# Patient Record
Sex: Female | Born: 1980 | Race: White | Hispanic: No | Marital: Single | Smoking: Never smoker
Health system: Southern US, Community
[De-identification: ages and names within clinical notes are randomized; demographics above are authoritative.]

## PROBLEM LIST (undated history)

## (undated) DIAGNOSIS — R51 Headache: Secondary | ICD-10-CM

## (undated) DIAGNOSIS — Z8619 Personal history of other infectious and parasitic diseases: Secondary | ICD-10-CM

## (undated) DIAGNOSIS — K219 Gastro-esophageal reflux disease without esophagitis: Secondary | ICD-10-CM

## (undated) DIAGNOSIS — C801 Malignant (primary) neoplasm, unspecified: Secondary | ICD-10-CM

## (undated) HISTORY — PX: KNEE SURGERY: SHX244

## (undated) HISTORY — DX: Malignant (primary) neoplasm, unspecified: C80.1

## (undated) HISTORY — PX: BREAST SURGERY: SHX581

## (undated) HISTORY — DX: Personal history of other infectious and parasitic diseases: Z86.19

## (undated) HISTORY — DX: Gastro-esophageal reflux disease without esophagitis: K21.9

## (undated) HISTORY — DX: Headache: R51

## (undated) HISTORY — PX: TONSILLECTOMY: SUR1361

---

## 2007-05-24 ENCOUNTER — Other Ambulatory Visit: Payer: Self-pay

## 2007-05-24 ENCOUNTER — Emergency Department: Payer: Self-pay

## 2007-09-19 ENCOUNTER — Ambulatory Visit: Payer: Self-pay | Admitting: Internal Medicine

## 2008-07-17 ENCOUNTER — Ambulatory Visit: Payer: Self-pay | Admitting: Sports Medicine

## 2009-08-20 ENCOUNTER — Emergency Department: Payer: Self-pay | Admitting: Emergency Medicine

## 2010-02-10 ENCOUNTER — Ambulatory Visit: Payer: Self-pay | Admitting: Unknown Physician Specialty

## 2011-03-25 ENCOUNTER — Ambulatory Visit: Payer: Self-pay | Admitting: Unknown Physician Specialty

## 2012-08-14 ENCOUNTER — Ambulatory Visit: Payer: Self-pay | Admitting: Family Medicine

## 2012-08-17 LAB — OB RESULTS CONSOLE ABO/RH: RH Type: POSITIVE

## 2012-08-17 LAB — OB RESULTS CONSOLE HIV ANTIBODY (ROUTINE TESTING): HIV: NONREACTIVE

## 2012-08-17 LAB — OB RESULTS CONSOLE ANTIBODY SCREEN: Antibody Screen: NEGATIVE

## 2012-08-17 LAB — OB RESULTS CONSOLE RPR: RPR: NONREACTIVE

## 2012-08-17 LAB — OB RESULTS CONSOLE RUBELLA ANTIBODY, IGM: Rubella: NON-IMMUNE/NOT IMMUNE

## 2012-08-27 ENCOUNTER — Other Ambulatory Visit: Payer: Self-pay

## 2012-08-27 LAB — OB RESULTS CONSOLE GC/CHLAMYDIA
Chlamydia: NEGATIVE
Gonorrhea: NEGATIVE

## 2012-10-17 ENCOUNTER — Emergency Department: Payer: Self-pay | Admitting: Emergency Medicine

## 2012-10-17 LAB — CBC WITH DIFFERENTIAL/PLATELET
Basophil #: 0.1 10*3/uL (ref 0.0–0.1)
Basophil %: 0.7 %
Eosinophil #: 0 10*3/uL (ref 0.0–0.7)
Eosinophil %: 0.5 %
HGB: 11.2 g/dL — ABNORMAL LOW (ref 12.0–16.0)
Lymphocyte #: 1.5 10*3/uL (ref 1.0–3.6)
Lymphocyte %: 17.1 %
MCH: 30.7 pg (ref 26.0–34.0)
Monocyte #: 0.3 x10 3/mm (ref 0.2–0.9)
Monocyte %: 3.6 %
Neutrophil #: 6.9 10*3/uL — ABNORMAL HIGH (ref 1.4–6.5)
Neutrophil %: 78.1 %
Platelet: 284 10*3/uL (ref 150–440)
RBC: 3.63 10*6/uL — ABNORMAL LOW (ref 3.80–5.20)
RDW: 13.5 % (ref 11.5–14.5)

## 2012-10-17 LAB — URINALYSIS, COMPLETE
Bilirubin,UR: NEGATIVE
Blood: NEGATIVE
Hyaline Cast: 3
Protein: NEGATIVE
RBC,UR: 1 /HPF (ref 0–5)
Squamous Epithelial: 2

## 2012-10-17 LAB — BASIC METABOLIC PANEL
BUN: 7 mg/dL (ref 7–18)
Co2: 24 mmol/L (ref 21–32)
Creatinine: 0.49 mg/dL — ABNORMAL LOW (ref 0.60–1.30)
EGFR (African American): >60
EGFR (Non-African Amer.): >60

## 2012-11-19 ENCOUNTER — Other Ambulatory Visit (HOSPITAL_COMMUNITY): Payer: Self-pay | Admitting: Obstetrics

## 2012-11-19 DIAGNOSIS — R9389 Abnormal findings on diagnostic imaging of other specified body structures: Secondary | ICD-10-CM

## 2012-11-20 ENCOUNTER — Encounter (HOSPITAL_COMMUNITY): Payer: Self-pay

## 2012-11-20 ENCOUNTER — Ambulatory Visit (HOSPITAL_COMMUNITY)
Admission: RE | Admit: 2012-11-20 | Discharge: 2012-11-20 | Disposition: A | Discharge status: Home / Self Care | Payer: BC Managed Care – PPO | Source: Ambulatory Visit | Attending: Obstetrics | Admitting: Obstetrics

## 2012-11-20 DIAGNOSIS — R9389 Abnormal findings on diagnostic imaging of other specified body structures: Secondary | ICD-10-CM

## 2012-11-20 DIAGNOSIS — O358XX0 Maternal care for other (suspected) fetal abnormality and damage, not applicable or unspecified: Secondary | ICD-10-CM | POA: Insufficient documentation

## 2012-11-20 DIAGNOSIS — Z1389 Encounter for screening for other disorder: Secondary | ICD-10-CM | POA: Insufficient documentation

## 2012-11-20 DIAGNOSIS — Z363 Encounter for antenatal screening for malformations: Secondary | ICD-10-CM | POA: Insufficient documentation

## 2012-11-21 NOTE — Progress Notes (Signed)
Genetic Counseling  High-Risk Gestation Note  Appointment Date:  11/20/2012 Referred By: Lendon Colonel., MD Date of Birth:  02/03/1981    Pregnancy History: G1P0000 Estimated Date of Delivery: 03/16/13 Estimated Gestational Age: [redacted]w[redacted]d Attending: Particia Nearing, MD   Ms. Krista Alexander was seen for genetic counseling because of the previous ultrasound finding of possible ventriculomegaly. The patient was accompanied by a friend to today's visit.   We discussed that medical records from the patient's OB office indicated a lateral ventricle measurement of 1.05 cm (or 10.5 mm).   We counseled the patient that ventriculomegaly refers to an increased measurement of the lateral ventricles in the brain greater than 10 mm.  Possible causes for mild ventriculomegaly include overproduction of cerebrospinal fluid, a blockage in a duct causing the fluid to accumulate, an intrauterine infection, or a variation of normal. We discussed that once ventriculomegaly is identified in pregnancy, follow-up ultrasounds are offered to assess for progression of ventriculomegaly. Following delivery, post-natal studies may be required in order to track progress and assess for any other differences in brain anatomy. It was discussed that surgical intervention may be required in some cases of ventriculomegaly. Most cases of isolated mild ventriculomegaly do not have an identified cause or associated condition, tend to regress with time, and have no resulting health or learning problems. However, it is possible that this finding may be seen in association with other ultrasound differences or a genetic condition. There is also a slightly increased chance of developmental delays. We discussed that the presence of ventriculomegaly is associated with an increased risk for aneuploidy, specifically Down syndrome.   We reviewed chromosomes, nondisjunction, and the features and prognosis of Down syndrome. She was counseled  regarding maternal age and the association with risk for chromosome conditions due to nondisjunction with aging of ova. Given the patient's age alone, the pregnancy would not be considered at increased risk for fetal aneuploidy. We reviewed that Krista Alexander previously had first trimester screening, which increased the risk for fetal Down syndrome from her age related risk of 1 in 110 to 1 in 61. However, this is still considered within the normal range. We discussed that in the case of an ultrasound finding of mild ventriculomegaly, the risk for Down syndrome would be increased from the patient's first trimester screen risk to approximately 1 in 39. The patient was counseled regarding the options of ultrasound, noninvasive prenatal screening (NIPS)/cell free fetal DNA testing and amniocentesis. The risks, benefits, and limitations of each screen were reviewed. Specifically, we discussed the conditions for which each test screens, the detection rates, and false positive rates of each.  It was discussed that not all birth defects can be identified with these procedures. A risk of 1 in 300-500 was given for amniocentesis, the primary complication being spontaneous pregnancy loss or preterm delivery.  After careful consideration, she decided to continue with ultrasound but declined amniocentesis and NIPS.  Targeted ultrasound was performed at the time of today's visit. Visualized fetal anatomy appeared normal. The left cerebral ventricle measured within normal range. The right cerebral ventricle could not be visualized and assessed due to fetal position. No markers of aneuploidy were visualized at the time of today's ultrasound. Complete ultrasound results reported separately. A follow-up ultrasound is recommended in approximately 6 weeks to reassess fetal cerebral ventricles. No appointments were scheduled in our office at this time. We are happy to facilitate this appointment, if desired.   Both family  histories were reviewed and found to  be noncontributory for birth defects, mental retardation, and known genetic conditions. Without further information regarding the provided family history, an accurate genetic risk cannot be calculated. Further genetic counseling is warranted if more information is obtained.  Krista Alexander denied exposure to environmental toxins or chemical agents. She denied the use of alcohol, tobacco or street drugs. She denied significant viral illnesses during the course of her pregnancy. Her medical and surgical histories were noncontributory.   I counseled Ms. Krista Alexander regarding the above risks and available options.  The approximate face-to-face time with the genetic counselor was 35 minutes.  Quinn Plowman, MS,  Certified Genetic Counselor 11/21/2012

## 2013-02-18 LAB — OB RESULTS CONSOLE GBS: GBS: NEGATIVE

## 2013-03-16 ENCOUNTER — Inpatient Hospital Stay (HOSPITAL_COMMUNITY): Admission: AD | Admit: 2013-03-16 | Payer: Self-pay | Source: Ambulatory Visit | Admitting: Obstetrics

## 2013-03-20 ENCOUNTER — Other Ambulatory Visit: Payer: Self-pay | Admitting: Obstetrics

## 2013-03-21 ENCOUNTER — Telehealth (HOSPITAL_COMMUNITY): Payer: Self-pay | Admitting: *Deleted

## 2013-03-21 ENCOUNTER — Encounter (HOSPITAL_COMMUNITY): Payer: Self-pay | Admitting: *Deleted

## 2013-03-21 NOTE — Telephone Encounter (Signed)
Preadmission screen  

## 2013-03-26 ENCOUNTER — Inpatient Hospital Stay (HOSPITAL_COMMUNITY): Payer: BC Managed Care – PPO | Admitting: Anesthesiology

## 2013-03-26 ENCOUNTER — Inpatient Hospital Stay (HOSPITAL_COMMUNITY)
Admission: RE | Admit: 2013-03-26 | Discharge: 2013-03-30 | DRG: 765 | Disposition: A | Discharge status: Home / Self Care | Payer: BC Managed Care – PPO | Source: Ambulatory Visit | Attending: Obstetrics | Admitting: Obstetrics

## 2013-03-26 ENCOUNTER — Encounter (HOSPITAL_COMMUNITY): Payer: BC Managed Care – PPO | Admitting: Anesthesiology

## 2013-03-26 ENCOUNTER — Encounter (HOSPITAL_COMMUNITY): Payer: Self-pay

## 2013-03-26 DIAGNOSIS — O9902 Anemia complicating childbirth: Secondary | ICD-10-CM | POA: Diagnosis present

## 2013-03-26 DIAGNOSIS — O480 Post-term pregnancy: Principal | ICD-10-CM | POA: Diagnosis present

## 2013-03-26 DIAGNOSIS — O358XX0 Maternal care for other (suspected) fetal abnormality and damage, not applicable or unspecified: Secondary | ICD-10-CM | POA: Diagnosis present

## 2013-03-26 DIAGNOSIS — D62 Acute posthemorrhagic anemia: Secondary | ICD-10-CM | POA: Diagnosis present

## 2013-03-26 LAB — ABO/RH: ABO/RH(D): A POS

## 2013-03-26 LAB — CBC
HCT: 32.2 % — ABNORMAL LOW (ref 36.0–46.0)
Hemoglobin: 11.2 g/dL — ABNORMAL LOW (ref 12.0–15.0)
MCHC: 34.8 g/dL (ref 30.0–36.0)
Platelets: 248 10*3/uL (ref 150–400)
RDW: 13.6 % (ref 11.5–15.5)
WBC: 9.6 10*3/uL (ref 4.0–10.5)

## 2013-03-26 LAB — TYPE AND SCREEN: Antibody Screen: NEGATIVE

## 2013-03-26 LAB — RPR: RPR Ser Ql: NONREACTIVE

## 2013-03-26 MED ORDER — PHENYLEPHRINE 40 MCG/ML (10ML) SYRINGE FOR IV PUSH (FOR BLOOD PRESSURE SUPPORT)
80.0000 ug | PREFILLED_SYRINGE | INTRAVENOUS | Status: DC | PRN
  Administered 2013-03-26: 80 ug via INTRAVENOUS

## 2013-03-26 MED ORDER — OXYTOCIN BOLUS FROM INFUSION: 500.0000 mL | INTRAVENOUS | Status: DC

## 2013-03-26 MED ORDER — OXYTOCIN 40 UNITS IN LACTATED RINGERS INFUSION - SIMPLE MED: 62.5000 mL/h | INTRAVENOUS | Status: DC

## 2013-03-26 MED ORDER — ACETAMINOPHEN 325 MG PO TABS: 650.0000 mg | ORAL_TABLET | ORAL | Status: DC | PRN

## 2013-03-26 MED ORDER — DIPHENHYDRAMINE HCL 50 MG/ML IJ SOLN: 12.5000 mg | INTRAMUSCULAR | Status: DC | PRN

## 2013-03-26 MED ORDER — IBUPROFEN 600 MG PO TABS: 600.0000 mg | ORAL_TABLET | Freq: Four times a day (QID) | ORAL | Status: DC | PRN

## 2013-03-26 MED ORDER — LACTATED RINGERS IV SOLN
INTRAVENOUS | Status: DC
  Administered 2013-03-26 – 2013-03-27 (×6): via INTRAVENOUS

## 2013-03-26 MED ORDER — CITRIC ACID-SODIUM CITRATE 334-500 MG/5ML PO SOLN
30.0000 mL | ORAL | Status: DC | PRN
  Administered 2013-03-27: 30 mL via ORAL
  Filled 2013-03-26: qty 15

## 2013-03-26 MED ORDER — FLEET ENEMA 7-19 GM/118ML RE ENEM: 1.0000 | ENEMA | RECTAL | Status: DC | PRN

## 2013-03-26 MED ORDER — FENTANYL 2.5 MCG/ML BUPIVACAINE 1/10 % EPIDURAL INFUSION (WH - ANES)
14.0000 mL/h | INTRAMUSCULAR | Status: DC | PRN
  Administered 2013-03-26: 16 mL/h via EPIDURAL
  Administered 2013-03-27: 14 mL/h via EPIDURAL
  Filled 2013-03-26 (×2): qty 125

## 2013-03-26 MED ORDER — ONDANSETRON HCL 4 MG/2ML IJ SOLN
4.0000 mg | Freq: Four times a day (QID) | INTRAMUSCULAR | Status: DC | PRN
  Administered 2013-03-26: 4 mg via INTRAVENOUS
  Filled 2013-03-26: qty 2

## 2013-03-26 MED ORDER — LACTATED RINGERS IV SOLN
500.0000 mL | Freq: Once | INTRAVENOUS | Status: AC
  Administered 2013-03-26: 500 mL via INTRAVENOUS

## 2013-03-26 MED ORDER — LIDOCAINE HCL (PF) 1 % IJ SOLN
INTRAMUSCULAR | Status: DC | PRN
  Administered 2013-03-26 (×5): 4 mL

## 2013-03-26 MED ORDER — LACTATED RINGERS IV SOLN
500.0000 mL | INTRAVENOUS | Status: DC | PRN
  Administered 2013-03-27: 1000 mL via INTRAVENOUS

## 2013-03-26 MED ORDER — OXYTOCIN 40 UNITS IN LACTATED RINGERS INFUSION - SIMPLE MED
1.0000 m[IU]/min | INTRAVENOUS | Status: DC
  Administered 2013-03-26: 2 m[IU]/min via INTRAVENOUS
  Administered 2013-03-26: 10 m[IU]/min via INTRAVENOUS
  Filled 2013-03-26: qty 1000

## 2013-03-26 MED ORDER — TERBUTALINE SULFATE 1 MG/ML IJ SOLN
0.2500 mg | Freq: Once | INTRAMUSCULAR | Status: AC | PRN
  Filled 2013-03-26: qty 1

## 2013-03-26 MED ORDER — OXYCODONE-ACETAMINOPHEN 5-325 MG PO TABS: 1.0000 | ORAL_TABLET | ORAL | Status: DC | PRN

## 2013-03-26 MED ORDER — PHENYLEPHRINE 40 MCG/ML (10ML) SYRINGE FOR IV PUSH (FOR BLOOD PRESSURE SUPPORT)
80.0000 ug | PREFILLED_SYRINGE | INTRAVENOUS | Status: DC | PRN
  Filled 2013-03-26: qty 10

## 2013-03-26 MED ORDER — FAMOTIDINE 20 MG PO TABS
20.0000 mg | ORAL_TABLET | Freq: Two times a day (BID) | ORAL | Status: DC
  Administered 2013-03-26: 20 mg via ORAL
  Filled 2013-03-26: qty 1

## 2013-03-26 MED ORDER — LIDOCAINE HCL (PF) 1 % IJ SOLN
30.0000 mL | INTRAMUSCULAR | Status: DC | PRN
  Filled 2013-03-26: qty 30

## 2013-03-26 MED ORDER — EPHEDRINE 5 MG/ML INJ: 10.0000 mg | INTRAVENOUS | Status: DC | PRN

## 2013-03-26 MED ORDER — EPHEDRINE 5 MG/ML INJ
10.0000 mg | INTRAVENOUS | Status: DC | PRN
  Filled 2013-03-26: qty 4

## 2013-03-26 NOTE — Anesthesia Preprocedure Evaluation (Addendum)
Anesthesia Evaluation  Patient identified by MRN, date of birth, ID band Patient awake    Reviewed: Allergy & Precautions, H&P , NPO status , Patient's Chart, lab work & pertinent test results, reviewed documented beta blocker date and time   History of Anesthesia Complications Negative for: history of anesthetic complications  Airway Mallampati: II TM Distance: >3 FB Neck ROM: full    Dental  (+) Teeth Intact   Pulmonary neg pulmonary ROS,  breath sounds clear to auscultation        Cardiovascular negative cardio ROS  Rhythm:regular Rate:Normal     Neuro/Psych  Headaches (migraines), negative psych ROS   GI/Hepatic Neg liver ROS, GERD-  Medicated,  Endo/Other  negative endocrine ROS  Renal/GU negative Renal ROS  negative genitourinary   Musculoskeletal   Abdominal   Peds  Hematology negative hematology ROS (+)   Anesthesia Other Findings   Reproductive/Obstetrics (+) Pregnancy (failure to descend --> c/s)                          Anesthesia Physical Anesthesia Plan  ASA: II and emergent  Anesthesia Plan: Epidural   Post-op Pain Management:    Induction:   Airway Management Planned:   Additional Equipment:   Intra-op Plan:   Post-operative Plan:   Informed Consent: I have reviewed the patients History and Physical, chart, labs and discussed the procedure including the risks, benefits and alternatives for the proposed anesthesia with the patient or authorized representative who has indicated his/her understanding and acceptance.     Plan Discussed with: Surgeon and CRNA  Anesthesia Plan Comments:        Anesthesia Quick Evaluation

## 2013-03-26 NOTE — H&P (Signed)
Krista Alexander is a 32 y.o. G1P0000 at [redacted]w[redacted]d presenting for IOL. Pt notes occasional contractions. Good fetal movement, No vaginal bleeding, not leaking fluid.  [facility] at [facility] since 9 wks - dated by 9 wk u/s. 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 On Zantac and Diclegis for much of the pregnancy - migraine w/ visual symptoms, s/p neuro consult, Fiorcet prn  - growth u/s. 41 wks. 8'4, 63%, vtx   Prenatal Transfer Tool  Maternal Diabetes: No Genetic Screening: Normal Maternal Ultrasounds/Referrals: Normal Fetal Ultrasounds or other Referrals:  Referred to Materal Fetal Medicine  ventriculomegaly Maternal Substance Abuse:  No Significant Maternal Medications:  Meds include: Zantac Other:  iron, Fiorcet, Diclegis, iron, PNV Significant Maternal Lab Results: None     OB History   Grav Para Term Preterm Abortions TAB SAB Ect Mult Living   1 0 0 0 0 0 0 0 0 0      Past Medical History  Diagnosis Date  . Hx of varicella   . Cancer     melanoma R lower ext good margins  . GERD (gastroesophageal reflux disease)   . Headache(784.0)     migraine   Past Surgical History  Procedure Laterality Date  . Breast surgery      aug  . Knee surgery    . Tonsillectomy     Family History: family history includes Alcohol abuse in her paternal uncle; Cancer in her maternal grandmother and paternal grandmother; Migraines in her brother. Social History:  reports that she has never smoked. She has never used smokeless tobacco. She reports that she does not drink alcohol or use illicit drugs.  Review of Systems - Negative except discomfort of pregnancy     There were no vitals taken for this visit.  Physical Exam:  Gen: well appearing, no distress Back: no CVAT Abd: gravid, NT, no RUQ pain LE: 1+ edema, equal bilaterally, non-tender    Prenatal labs: ABO, Rh: A/Positive/--  (03/28 0000) Antibody: Negative (03/28 0000) Rubella:  immune RPR: Nonreactive (03/28 0000)  HBsAg: Negative (03/28 0000)  HIV: Non-reactive (03/28 0000)  GBS: Negative (09/29 0000)  1 hr Glucola 120  Genetic screening nl NT, nl AFO Anatomy US ventriculomegaly   Assessment/Plan: 32 y.o. G1P0000 at [redacted]w[redacted]d Pit/ AROM when able due to post-dates status - alert peds to ventriculomegaly   Krista Enrico A. 03/26/2013, 7:51 AM

## 2013-03-26 NOTE — Progress Notes (Signed)
S: Doing well, no complaints, pain well controlled without epidural, feeling mild ctx. SROM at 1:30 pm, some increase in freq but not intensity to ctx since then  O: BP 127/76  Pulse 64  Temp(Src) 98.3 F (36.8 C) (Oral)  Resp 20  Ht 5\' 11"  (1.803 m)  Wt 98.884 kg (218 lb)  BMI 30.42 kg/m2   FHT:  FHR: 150s bpm, variability: moderate,  accelerations:  Present,  decelerations:  Present variable decels to 120's, <30 sec w/ most but not all ctx UC:   regular, every 2 minutes, pit at 59munits/ min SVE:   Dilation: 2 Effacement (%): 50 Station: -1 Exam by:: Valentina Lucks, RN   A / P:  31 y.o.  Obstetric History   G1   P0   T0   P0   A0   TAB0   SAB0   E0   M0   L0    at [redacted]w[redacted]d Induction of labor due to postterm,  progressing well on pitocin  Fetal Wellbeing:  Category I will watch variable decels closely, may need IUPC to ensure not late in timing, though overall reactive testing Pain Control:  Epidural and planned  Anticipated MOD:  NSVD  Corbin Hott A. 03/26/2013, 3:14 PM

## 2013-03-26 NOTE — Anesthesia Procedure Notes (Signed)
Epidural Patient location during procedure: OB Start time: 03/26/2013 8:34 PM  Staffing Performed by: anesthesiologist   Preanesthetic Checklist Completed: patient identified, site marked, surgical consent, pre-op evaluation, timeout performed, IV checked, risks and benefits discussed and monitors and equipment checked  Epidural Patient position: sitting Prep: site prepped and draped and DuraPrep Patient monitoring: continuous pulse ox and blood pressure Approach: midline Injection technique: LOR air  Needle:  Needle type: Tuohy  Needle gauge: 17 G Needle length: 9 cm and 9 Needle insertion depth: 5.5 cm Catheter type: closed end flexible Catheter size: 19 Gauge Catheter at skin depth: 10.5 cm Test dose: negative  Assessment Events: blood not aspirated, injection not painful, no injection resistance, negative IV test and no paresthesia  Additional Notes Discussed risk of headache, infection, bleeding, nerve injury and failed or incomplete block.  Patient voices understanding and wishes to proceed.  Epidural placed easily on first attempt.  No paresthesia.  Patient tolerated procedure well with no apparent complications.  Jasmine December, MDReason for block:procedure for pain

## 2013-03-26 NOTE — Progress Notes (Signed)
Late entry about 7:30p  S: feeling just mild ctx, bloody show, planning epidural  O: Filed Vitals:   03/26/13 1802 03/26/13 1833 03/26/13 1902 03/26/13 1932  BP: 119/76 112/75 119/73 132/85  Pulse: 62 69 64 64  Temp:    98.3 F (36.8 C)  TempSrc:    Oral  Resp:    18  Height:      Weight:       Toco: q 2-3 min, pitocin on  FH: no decels, 10 beat var Cvx: 3/80%/vtx 0, AROM forebag thin meconium  A/P: IOL, post dates Reactive fetal testing Cont pitocin, expect ctx to get stronger with AROM  Krista Alexander A. 03/26/2013 8:39 PM

## 2013-03-27 ENCOUNTER — Encounter (HOSPITAL_COMMUNITY): Payer: Self-pay

## 2013-03-27 ENCOUNTER — Encounter (HOSPITAL_COMMUNITY)
Admission: RE | Disposition: A | Discharge status: Home / Self Care | Payer: Self-pay | Source: Ambulatory Visit | Attending: Obstetrics

## 2013-03-27 SURGERY — Surgical Case
Anesthesia: Epidural | Site: Abdomen | Wound class: Clean Contaminated

## 2013-03-27 MED ORDER — SODIUM BICARBONATE 8.4 % IV SOLN
INTRAVENOUS | Status: AC
Start: 2013-03-27 — End: 2013-03-27
  Filled 2013-03-27: qty 50

## 2013-03-27 MED ORDER — DIBUCAINE 1 % RE OINT: 1.0000 "application " | TOPICAL_OINTMENT | RECTAL | Status: DC | PRN

## 2013-03-27 MED ORDER — SIMETHICONE 80 MG PO CHEW
80.0000 mg | CHEWABLE_TABLET | Freq: Three times a day (TID) | ORAL | Status: DC
  Administered 2013-03-27 – 2013-03-30 (×8): 80 mg via ORAL
  Filled 2013-03-27 (×8): qty 1

## 2013-03-27 MED ORDER — TETANUS-DIPHTH-ACELL PERTUSSIS 5-2.5-18.5 LF-MCG/0.5 IM SUSP: 0.5000 mL | Freq: Once | INTRAMUSCULAR | Status: DC

## 2013-03-27 MED ORDER — LACTATED RINGERS IV SOLN
INTRAVENOUS | Status: DC
  Administered 2013-03-27: 14:00:00 via INTRAVENOUS

## 2013-03-27 MED ORDER — FENTANYL CITRATE 0.05 MG/ML IJ SOLN: 25.0000 ug | INTRAMUSCULAR | Status: DC | PRN

## 2013-03-27 MED ORDER — OXYTOCIN 10 UNIT/ML IJ SOLN
INTRAMUSCULAR | Status: AC
  Filled 2013-03-27: qty 4

## 2013-03-27 MED ORDER — OXYTOCIN 10 UNIT/ML IJ SOLN
40.0000 [IU] | INTRAVENOUS | Status: DC | PRN
  Administered 2013-03-27: 40 [IU] via INTRAVENOUS

## 2013-03-27 MED ORDER — LIDOCAINE-EPINEPHRINE (PF) 2 %-1:200000 IJ SOLN
INTRAMUSCULAR | Status: AC
  Filled 2013-03-27: qty 20

## 2013-03-27 MED ORDER — OXYTOCIN 40 UNITS IN LACTATED RINGERS INFUSION - SIMPLE MED: 62.5000 mL/h | INTRAVENOUS | Status: AC

## 2013-03-27 MED ORDER — ONDANSETRON HCL 4 MG/2ML IJ SOLN: 4.0000 mg | INTRAMUSCULAR | Status: DC | PRN

## 2013-03-27 MED ORDER — EPHEDRINE 5 MG/ML INJ
INTRAVENOUS | Status: AC
  Filled 2013-03-27: qty 10

## 2013-03-27 MED ORDER — MEPERIDINE HCL 25 MG/ML IJ SOLN: 6.2500 mg | INTRAMUSCULAR | Status: DC | PRN

## 2013-03-27 MED ORDER — DIPHENHYDRAMINE HCL 50 MG/ML IJ SOLN: 12.5000 mg | INTRAMUSCULAR | Status: DC | PRN

## 2013-03-27 MED ORDER — FENTANYL CITRATE 0.05 MG/ML IJ SOLN
INTRAMUSCULAR | Status: AC
  Filled 2013-03-27: qty 2

## 2013-03-27 MED ORDER — SCOPOLAMINE 1 MG/3DAYS TD PT72: 1.0000 | MEDICATED_PATCH | Freq: Once | TRANSDERMAL | Status: DC

## 2013-03-27 MED ORDER — SIMETHICONE 80 MG PO CHEW: 80.0000 mg | CHEWABLE_TABLET | ORAL | Status: DC | PRN

## 2013-03-27 MED ORDER — ZOLPIDEM TARTRATE 5 MG PO TABS: 5.0000 mg | ORAL_TABLET | Freq: Every evening | ORAL | Status: DC | PRN

## 2013-03-27 MED ORDER — ONDANSETRON HCL 4 MG/2ML IJ SOLN
INTRAMUSCULAR | Status: AC
  Filled 2013-03-27: qty 2

## 2013-03-27 MED ORDER — LANOLIN HYDROUS EX OINT: 1.0000 "application " | TOPICAL_OINTMENT | CUTANEOUS | Status: DC | PRN

## 2013-03-27 MED ORDER — KETOROLAC TROMETHAMINE 60 MG/2ML IM SOLN
60.0000 mg | Freq: Once | INTRAMUSCULAR | Status: AC | PRN
  Administered 2013-03-27: 60 mg via INTRAMUSCULAR

## 2013-03-27 MED ORDER — HYDROCODONE-ACETAMINOPHEN 5-325 MG PO TABS
2.0000 | ORAL_TABLET | Freq: Four times a day (QID) | ORAL | Status: DC | PRN
  Filled 2013-03-27: qty 1

## 2013-03-27 MED ORDER — NALBUPHINE HCL 10 MG/ML IJ SOLN
5.0000 mg | INTRAMUSCULAR | Status: DC | PRN
  Filled 2013-03-27: qty 1

## 2013-03-27 MED ORDER — LIDOCAINE HCL 1 % IJ SOLN
INTRAMUSCULAR | Status: DC | PRN
  Administered 2013-03-27: 10 mL

## 2013-03-27 MED ORDER — METOCLOPRAMIDE HCL 5 MG/ML IJ SOLN: 10.0000 mg | Freq: Three times a day (TID) | INTRAMUSCULAR | Status: DC | PRN

## 2013-03-27 MED ORDER — NALOXONE HCL 0.4 MG/ML IJ SOLN: 0.4000 mg | INTRAMUSCULAR | Status: DC | PRN

## 2013-03-27 MED ORDER — WITCH HAZEL-GLYCERIN EX PADS: 1.0000 "application " | MEDICATED_PAD | CUTANEOUS | Status: DC | PRN

## 2013-03-27 MED ORDER — CEFAZOLIN SODIUM-DEXTROSE 2-3 GM-% IV SOLR
INTRAVENOUS | Status: DC | PRN
  Administered 2013-03-27: 2 g via INTRAVENOUS

## 2013-03-27 MED ORDER — PHENYLEPHRINE 40 MCG/ML (10ML) SYRINGE FOR IV PUSH (FOR BLOOD PRESSURE SUPPORT)
PREFILLED_SYRINGE | INTRAVENOUS | Status: AC
  Filled 2013-03-27: qty 5

## 2013-03-27 MED ORDER — KETOROLAC TROMETHAMINE 30 MG/ML IJ SOLN: 30.0000 mg | Freq: Four times a day (QID) | INTRAMUSCULAR | Status: DC | PRN

## 2013-03-27 MED ORDER — DIPHENHYDRAMINE HCL 25 MG PO CAPS
25.0000 mg | ORAL_CAPSULE | ORAL | Status: DC | PRN
  Filled 2013-03-27: qty 1

## 2013-03-27 MED ORDER — MENTHOL 3 MG MT LOZG: 1.0000 | LOZENGE | OROMUCOSAL | Status: DC | PRN

## 2013-03-27 MED ORDER — IBUPROFEN 600 MG PO TABS
600.0000 mg | ORAL_TABLET | Freq: Four times a day (QID) | ORAL | Status: DC
  Administered 2013-03-27 – 2013-03-30 (×12): 600 mg via ORAL
  Filled 2013-03-27 (×12): qty 1

## 2013-03-27 MED ORDER — FENTANYL CITRATE 0.05 MG/ML IJ SOLN
INTRAMUSCULAR | Status: DC | PRN
  Administered 2013-03-27: 50 ug via INTRAVENOUS

## 2013-03-27 MED ORDER — PANTOPRAZOLE SODIUM 40 MG PO TBEC
40.0000 mg | DELAYED_RELEASE_TABLET | Freq: Every day | ORAL | Status: DC
  Administered 2013-03-28 – 2013-03-30 (×3): 40 mg via ORAL
  Filled 2013-03-27 (×5): qty 1

## 2013-03-27 MED ORDER — SENNOSIDES-DOCUSATE SODIUM 8.6-50 MG PO TABS
2.0000 | ORAL_TABLET | ORAL | Status: DC
  Administered 2013-03-28 – 2013-03-30 (×3): 2 via ORAL
  Filled 2013-03-27 (×3): qty 2

## 2013-03-27 MED ORDER — KETOROLAC TROMETHAMINE 60 MG/2ML IM SOLN
INTRAMUSCULAR | Status: AC
  Filled 2013-03-27: qty 2

## 2013-03-27 MED ORDER — LIDOCAINE HCL 1 % IJ SOLN
INTRAMUSCULAR | Status: AC
  Filled 2013-03-27: qty 20

## 2013-03-27 MED ORDER — MORPHINE SULFATE (PF) 0.5 MG/ML IJ SOLN
INTRAMUSCULAR | Status: DC | PRN
  Administered 2013-03-27: 4 mg via EPIDURAL

## 2013-03-27 MED ORDER — DIPHENHYDRAMINE HCL 25 MG PO CAPS
25.0000 mg | ORAL_CAPSULE | Freq: Four times a day (QID) | ORAL | Status: DC | PRN
  Administered 2013-03-27 (×2): 25 mg via ORAL
  Filled 2013-03-27 (×3): qty 1

## 2013-03-27 MED ORDER — DEXTROSE 5 % IV SOLN
INTRAVENOUS | Status: AC
  Filled 2013-03-27: qty 3000

## 2013-03-27 MED ORDER — MORPHINE SULFATE 0.5 MG/ML IJ SOLN
INTRAMUSCULAR | Status: AC
  Filled 2013-03-27: qty 10

## 2013-03-27 MED ORDER — ONDANSETRON HCL 4 MG PO TABS: 4.0000 mg | ORAL_TABLET | ORAL | Status: DC | PRN

## 2013-03-27 MED ORDER — CEFAZOLIN SODIUM-DEXTROSE 2-3 GM-% IV SOLR
INTRAVENOUS | Status: AC
  Filled 2013-03-27: qty 50

## 2013-03-27 MED ORDER — LACTATED RINGERS IV SOLN
INTRAVENOUS | Status: DC | PRN
  Administered 2013-03-27: 07:00:00 via INTRAVENOUS

## 2013-03-27 MED ORDER — SODIUM BICARBONATE 8.4 % IV SOLN
INTRAVENOUS | Status: DC | PRN
  Administered 2013-03-27 (×8): 5 mL via EPIDURAL

## 2013-03-27 MED ORDER — DIPHENHYDRAMINE HCL 50 MG/ML IJ SOLN: 25.0000 mg | INTRAMUSCULAR | Status: DC | PRN

## 2013-03-27 MED ORDER — ONDANSETRON HCL 4 MG/2ML IJ SOLN
INTRAMUSCULAR | Status: DC | PRN
  Administered 2013-03-27: 4 mg via INTRAVENOUS

## 2013-03-27 MED ORDER — HYDROCODONE-ACETAMINOPHEN 5-325 MG PO TABS
1.0000 | ORAL_TABLET | Freq: Four times a day (QID) | ORAL | Status: DC | PRN
  Administered 2013-03-27 – 2013-03-29 (×4): 1 via ORAL
  Administered 2013-03-29: 2 via ORAL
  Administered 2013-03-30 (×2): 1 via ORAL
  Filled 2013-03-27 (×2): qty 1
  Filled 2013-03-27: qty 2
  Filled 2013-03-27 (×3): qty 1

## 2013-03-27 MED ORDER — ONDANSETRON HCL 4 MG/2ML IJ SOLN: 4.0000 mg | Freq: Three times a day (TID) | INTRAMUSCULAR | Status: DC | PRN

## 2013-03-27 MED ORDER — SIMETHICONE 80 MG PO CHEW
80.0000 mg | CHEWABLE_TABLET | ORAL | Status: DC
  Administered 2013-03-28 – 2013-03-30 (×3): 80 mg via ORAL
  Filled 2013-03-27 (×3): qty 1

## 2013-03-27 MED ORDER — PRENATAL MULTIVITAMIN CH
1.0000 | ORAL_TABLET | Freq: Every day | ORAL | Status: DC
  Administered 2013-03-28 – 2013-03-30 (×3): 1 via ORAL
  Filled 2013-03-27 (×3): qty 1

## 2013-03-27 MED ORDER — NALOXONE HCL 1 MG/ML IJ SOLN
1.0000 ug/kg/h | INTRAVENOUS | Status: DC | PRN
  Filled 2013-03-27: qty 2

## 2013-03-27 MED ORDER — SODIUM CHLORIDE 0.9 % IJ SOLN: 3.0000 mL | INTRAMUSCULAR | Status: DC | PRN

## 2013-03-27 SURGICAL SUPPLY — 35 items
CLAMP CORD UMBIL (MISCELLANEOUS) IMPLANT
CLOTH BEACON ORANGE TIMEOUT ST (SAFETY) ×2 IMPLANT
CONTAINER PREFILL 10% NBF 15ML (MISCELLANEOUS) IMPLANT
DRAPE LG THREE QUARTER DISP (DRAPES) ×4 IMPLANT
DRSG OPSITE POSTOP 4X10 (GAUZE/BANDAGES/DRESSINGS) ×2 IMPLANT
DURAPREP 26ML APPLICATOR (WOUND CARE) ×2 IMPLANT
ELECT REM PT RETURN 9FT ADLT (ELECTROSURGICAL) ×2
ELECTRODE REM PT RTRN 9FT ADLT (ELECTROSURGICAL) ×1 IMPLANT
EXTRACTOR VACUUM KIWI (MISCELLANEOUS) IMPLANT
EXTRACTOR VACUUM M CUP 4 TUBE (SUCTIONS) IMPLANT
GLOVE BIO SURGEON STRL SZ 6.5 (GLOVE) ×2 IMPLANT
GLOVE BIOGEL PI IND STRL 7.0 (GLOVE) ×1 IMPLANT
GLOVE BIOGEL PI INDICATOR 7.0 (GLOVE) ×1
GOWN PREVENTION PLUS XLARGE (GOWN DISPOSABLE) ×4 IMPLANT
GOWN STRL REIN XL XLG (GOWN DISPOSABLE) ×4 IMPLANT
KIT ABG SYR 3ML LUER SLIP (SYRINGE) IMPLANT
NEEDLE HYPO 25X1 1.5 SAFETY (NEEDLE) ×2 IMPLANT
NEEDLE HYPO 25X5/8 SAFETYGLIDE (NEEDLE) IMPLANT
NS IRRIG 1000ML POUR BTL (IV SOLUTION) ×2 IMPLANT
PACK C SECTION WH (CUSTOM PROCEDURE TRAY) ×2 IMPLANT
PAD OB MATERNITY 4.3X12.25 (PERSONAL CARE ITEMS) ×2 IMPLANT
STAPLER VISISTAT 35W (STAPLE) IMPLANT
STRIP CLOSURE SKIN 1/2X4 (GAUZE/BANDAGES/DRESSINGS) IMPLANT
SUT MON AB 4-0 PS1 27 (SUTURE) ×4 IMPLANT
SUT PLAIN 0 NONE (SUTURE) IMPLANT
SUT PLAIN 2 0 XLH (SUTURE) ×2 IMPLANT
SUT VIC AB 0 CT1 36 (SUTURE) ×2 IMPLANT
SUT VIC AB 0 CTX 36 (SUTURE) ×2
SUT VIC AB 0 CTX36XBRD ANBCTRL (SUTURE) ×2 IMPLANT
SUT VIC AB 2-0 CT1 27 (SUTURE) ×1
SUT VIC AB 2-0 CT1 TAPERPNT 27 (SUTURE) ×1 IMPLANT
SYR CONTROL 10ML LL (SYRINGE) ×2 IMPLANT
TOWEL OR 17X24 6PK STRL BLUE (TOWEL DISPOSABLE) ×2 IMPLANT
TRAY FOLEY CATH 14FR (SET/KITS/TRAYS/PACK) IMPLANT
WATER STERILE IRR 1000ML POUR (IV SOLUTION) IMPLANT

## 2013-03-27 NOTE — Progress Notes (Signed)
S: Doing well, no complaints, pain well controlled with epidural  O: BP 124/77  Pulse 69  Temp(Src) 97.8 F (36.6 C) (Oral)  Resp 18  Ht 5\' 11"  (1.803 m)  Wt 98.884 kg (218 lb)  BMI 30.42 kg/m2  SpO2 100%   FHT:  FHR: 140s bpm, variability: moderate,  accelerations:  Present,  decelerations:  Present variable decels with pushing UC:   regular, every 3 minutes SVE:   Dilation: 10 Effacement (%): 100 Station: +2 Exam by:: Fatisha Rabalais   A / P:  31 y.o.  Obstetric History   G1   P0   T0   P0   A0   TAB0   SAB0   E0   M0   L0    at [redacted]w[redacted]d Arrest of decent  Fetal Wellbeing:  Category I Pain Control:  Epidural  Anticipated MOD:  PCS. Pt fully dilated since MN, labored down for 2 hrs, still no urge, began pushing with head +2 station. now pushing 3hrs64min. No change in station despite good expulsive efforts in multiple different positions. Further eval reveals head +1-+2 station with caput and molding. Discussion over VAVD though pt does not accept risks of ICH. Head higher that initially thought and vacuum assistance not recommended. Decision made to proceed w/ c/s. R/B d/w pt who agrees.   Donaldo Teegarden A. 03/27/2013, 6:06 AM

## 2013-03-27 NOTE — Anesthesia Postprocedure Evaluation (Signed)
Anesthesia Post Note  Patient: Krista Alexander  Procedure(s) Performed: Procedure(s) (LRB): CESAREAN SECTION (N/A)  Anesthesia type: Epidural  Patient location: Mother/Baby  Post pain: Pain level controlled  Post assessment: Post-op Vital signs reviewed  Last Vitals:  Filed Vitals:   03/27/13 1636  BP: 118/69  Pulse: 71  Temp: 37.2 C  Resp: 18    Post vital signs: Reviewed  Level of consciousness:alert  Complications: No apparent anesthesia complications

## 2013-03-27 NOTE — Progress Notes (Signed)
S: Doing well, no complaints, pain well controlled with epidural. Labor down for past 2 hrs, not feeling urge to push  O: BP 105/62  Pulse 63  Temp(Src) 97.8 F (36.6 C) (Oral)  Resp 18  Ht 5\' 11"  (1.803 m)  Wt 98.884 kg (218 lb)  BMI 30.42 kg/m2  SpO2 100%   FHT:  FHR: 120s bpm, variability: moderate,  accelerations:  Present,  decelerations:  Present variable decels about 2 hrs ago with a run of frequent contractions, improved after contractions spaced and pitocin turned off. no decels in past 2 hrs UC:   regular, every 3-4 minutes SVE:   Dilation: 10 Effacement (%): 100 Station: +2 Exam by:: Esme Freund   A / P:  31 y.o.  Obstetric History   G1   P0   T0   P0   A0   TAB0   SAB0   E0   M0   L0    at [redacted]w[redacted]d Induction of labor due to postterm,  progressing well on pitocin  Fetal Wellbeing:  Category I, starting to see variable decels with pushing Pain Control:  Epidural  Anticipated MOD:  NSVD, start pushing now, has been at +2 for 2 hrs w/o feeling urge to push.   Gilman Olazabal A. 03/27/2013, 2:41 AM

## 2013-03-27 NOTE — Anesthesia Postprocedure Evaluation (Signed)
  Anesthesia Post-op Note  Patient: Krista Alexander  Procedure(s) Performed: Procedure(s): CESAREAN SECTION (N/A)  Patient Location: PACU  Anesthesia Type:Epidural  Level of Consciousness: awake, alert  and oriented  Airway and Oxygen Therapy: Patient Spontanous Breathing  Post-op Pain: none  Post-op Assessment: Post-op Vital signs reviewed, Patient's Cardiovascular Status Stable, Respiratory Function Stable, Patent Airway, No signs of Nausea or vomiting, Pain level controlled, No headache and No backache  Post-op Vital Signs: Reviewed and stable  Complications: No apparent anesthesia complications

## 2013-03-27 NOTE — Op Note (Signed)
03/26/2013 - 03/27/2013  7:38 AM  PATIENT:  Krista Alexander  32 y.o. female  PRE-OPERATIVE DIAGNOSIS:  Failure to Progress  POST-OPERATIVE DIAGNOSIS:  Failure to Progress  PROCEDURE:  Procedure(s): CESAREAN SECTION (N/A)  SURGEON:  Surgeon(s) and Role:    Tresa Endo A. Ernestina Penna, MD - Primary  PHYSICIAN ASSISTANT:   ASSISTANTS: none   ANESTHESIA:   local and epidural  EBL:     BLOOD ADMINISTERED:none  DRAINS: Urinary Catheter (Foley)   LOCAL MEDICATIONS USED:  LIDOCAINE  and Amount: 10 ml  SPECIMEN:  Source of Specimen:  placenta  DISPOSITION OF SPECIMEN:  L&D  COUNTS:  YES  TOURNIQUET:  * No tourniquets in log *  DICTATION: .note in Epic  PLAN OF CARE: Admit to inpatient   PATIENT DISPOSITION:  PACU - hemodynamically stable.   Delay start of Pharmacological VTE agent (>24hrs) due to surgical blood loss or risk of bleeding: yes    Findings:  @BABYSEXEBC @ infant,  APGAR (1 MIN): 9   APGAR (5 MINS): 9   APGAR (10 MINS):    Nl uterus, tubes, ovaries, meconium stained fluid EBL: 600 cc Antibiotics:  2g Ancef  Complications: none  Indications: This is a 32 y.o. year-old, G1  At [redacted]w[redacted]d admitted for IOL due to post-dates. Pt had slow progress into active labor. Once in active labor, received epidural and progressed normally to fully dilated. Pt then had passive descent to the +2 station for 2 hrs and pushed for almost 4 hrs w/o change in station. Decision made to proceed to [facility]. Risks benefits and alternatives of the procedure were discussed with the patient who agreed to proceed  Procedure:  After informed consent was obtained the patient was taken to the operating room.  She was prepped and draped in the normal sterile fashion in dorsal supine position with a leftward tilt.  A foley catheter was in place.  Epidural anesthesia was adjusted, increaed med given. Pt then passed Allis test but upon incision noted 'burning.' 10 cc 1% lidocaine given.   A Pfannenstiel skin  incision was made 2 cm above the pubic symphysis in the midline with the scalpel.  Dissection was carried down with the Bovie cautery until the fascia was reached. The fascia was incised in the midline. The incision was extended laterally with the Mayo scissors. The inferior aspect of the fascial incision was grasped with the Coker clamps, elevated up and the underlying rectus muscles were dissected off sharply. The superior aspect of the fascial incision was grasped with the Coker clamps elevated up and the underlying rectus muscles were dissected off sharply.  The peritoneum was entered bluntly. The peritoneal incision was extended superiorly and inferiorly with good visualization of the bladder. The bladder blade was inserted and palpation was done to assess the fetal position and the location of the uterine vessels. Given the low fetal station, a hand from below (Dr. Tinnie Gens) was used to elevate and rotate the head.The lower segment of the uterus was incised sharply with the scalpel and extended in a cephalo-caudal fashion bluntly. The infant was grasped, brought to the incision,  rotated and the infant was delivered with fundal pressure. The nose and mouth were bulb suctioned. The cord was clamped and cut. The infant was handed off to the waiting pediatrician. The placenta was expressed. The uterus was exteriorized. The uterus was cleared of all clots and debris. The uterine incision was repaired with 0 Vicryl in a running locked fashion.  A second  layer of the same suture was used in an imbricating fashion to obtain excellent hemostasis.  The uterus was then returned to the abdomen, the gutters were cleared of all clots and debris. The uterine incision was reinspected and found to be hemostatic. The peritoneum was grasped and closed with 2-0 Vicryl in a running fashion. The cut muscle edges and the underside of the fascia were inspected and found to be hemostatic. The fascia was closed with 0 Vicryl in two  halves . The subcutaneous tissue was irrigated. Scarpa's layer was closed with a 2-0 plain gut suture. The skin was closed with a 4-0 Monocryl in a single layer. The patient tolerated the procedure well. Sponge lap and needle counts were correct x3 and patient was taken to the recovery room in a stable condition.  [name] A. 03/27/2013 7:40 AM

## 2013-03-27 NOTE — Brief Op Note (Signed)
03/26/2013 - 03/27/2013  7:38 AM  PATIENT:  Krista Alexander  32 y.o. female  PRE-OPERATIVE DIAGNOSIS:  Failure to Progress  POST-OPERATIVE DIAGNOSIS:  Failure to Progress  PROCEDURE:  Procedure(s): CESAREAN SECTION (N/A)  SURGEON:  Surgeon(s) and Role:    Tresa Endo A. Ernestina Penna, MD - Primary  PHYSICIAN ASSISTANT:   ASSISTANTS: none   ANESTHESIA:   local and epidural  EBL:     BLOOD ADMINISTERED:none  DRAINS: Urinary Catheter (Foley)   LOCAL MEDICATIONS USED:  LIDOCAINE  and Amount: 10 ml  SPECIMEN:  Source of Specimen:  placenta  DISPOSITION OF SPECIMEN:  L&D  COUNTS:  YES  TOURNIQUET:  * No tourniquets in log *  DICTATION: .note in Epic  PLAN OF CARE: Admit to inpatient   PATIENT DISPOSITION:  PACU - hemodynamically stable.   Delay start of Pharmacological VTE agent (>24hrs) due to surgical blood loss or risk of bleeding: yes

## 2013-03-27 NOTE — Transfer of Care (Signed)
Immediate Anesthesia Transfer of Care Note  Patient: Krista Alexander  Procedure(s) Performed: Procedure(s): CESAREAN SECTION (N/A)  Patient Location: PACU  Anesthesia Type:Epidural  Level of Consciousness: awake  Airway & Oxygen Therapy: Patient Spontanous Breathing  Post-op Assessment: Report given to PACU RN and Post -op Vital signs reviewed and stable  Post vital signs: Reviewed and stable  Complications: No apparent anesthesia complications

## 2013-03-28 ENCOUNTER — Encounter (HOSPITAL_COMMUNITY): Payer: Self-pay | Admitting: Obstetrics

## 2013-03-28 LAB — CBC
HCT: 25 % — ABNORMAL LOW (ref 36.0–46.0)
Hemoglobin: 8.7 g/dL — ABNORMAL LOW (ref 12.0–15.0)
MCH: 30.3 pg (ref 26.0–34.0)
MCHC: 34.8 g/dL (ref 30.0–36.0)
MCV: 87.1 fL (ref 78.0–100.0)
Platelets: 183 10*3/uL (ref 150–400)
RDW: 13.8 % (ref 11.5–15.5)
WBC: 11.1 10*3/uL — ABNORMAL HIGH (ref 4.0–10.5)

## 2013-03-28 MED ORDER — POLYSACCHARIDE IRON COMPLEX 150 MG PO CAPS
150.0000 mg | ORAL_CAPSULE | Freq: Two times a day (BID) | ORAL | Status: DC
  Administered 2013-03-28 – 2013-03-30 (×5): 150 mg via ORAL
  Filled 2013-03-28 (×7): qty 1

## 2013-03-28 NOTE — Plan of Care (Signed)
Problem: Discharge Progression Outcomes Goal: MMR given as ordered Outcome: Not Met (add Reason) Needs MMR prior to D/C     

## 2013-03-28 NOTE — Progress Notes (Signed)
POD # 1  Subjective: Pt reports feeling good, sore/ Pain controlled with Motrin and Vicodin Tolerating po/ Voiding without problems/ No n/v/ Flatus absent Activity: ad lib Bleeding is light Newborn info:  Information for the patient's newborn:  Krista Alexander, Krista Alexander [[id number]]  female Feeding: breast   Objective:  VS:  Filed Vitals:   03/28/13 0400 03/28/13 0500 03/28/13 0619 03/28/13 0658  BP:   119/72   Pulse:   79   Temp:   98.5 F (36.9 C)   TempSrc:   Oral   Resp:   18   Height:      Weight:      SpO2: 98% 99% 98% 99%     I&O: Intake/Output     11/05 0701 - 11/06 0700 11/06 0701 - 11/07 0700   P.O. 1800    I.V. (mL/kg) 2537.1 (25.7)    Total Intake(mL/kg) 4337.1 (43.9)    Urine (mL/kg/hr) 3100 (1.3)    Blood     Total Output 3100     Net +1237.1          Urine Occurrence 1 x       Recent Labs  03/26/13 0805 03/28/13 0620  WBC 9.6 11.1*  HGB 11.2* 8.7*  HCT 32.2* 25.0*  PLT 248 183    Blood type: --/--/A POS, A POS (11/04 0805) Rubella: Nonimmune (03/28 0000)    Physical Exam:  General: alert and cooperative CV: Regular rate and rhythm Resp: CTA bilaterally Abdomen: soft, nontender, normal bowel sounds Incision: healing well, no erythema, no hernia, no seroma, no swelling, well approximated, small amount dark drainage-outlined Uterine Fundus: firm, below umbilicus, nontender Lochia: minimal Ext: edema trace to BLE and Homans sign is negative, no sign of DVT    Assessment: POD # 1/ G1P1001/ S/P C/Section d/t failure to progress IDA with compounding ABL anemia Doing well  Plan: Ambulate Start Niferex Continue routine post op orders   Signed: Donette Larry, Dorris Carnes, MSN, CNM 03/28/2013, 9:25 AM

## 2013-03-29 NOTE — Progress Notes (Signed)
Patient ID: Krista Alexander, female   DOB: 1981/02/19, 32 y.o.   MRN: [id number] POD # 2  Subjective: Pt reports feeling well/ Pain controlled with ibuprofen and vicodin, however, hesitant to take vicodin Tolerating po/Voiding without problems/ No n/v/Flatus pos Activity: out of bed and ambulate Bleeding is light Newborn info:  Information for the patient's newborn:  [name], [name] [[id number]]  female  Feeding: breast   Objective: VS: BP 109/69  Pulse 65  Temp(Src) 98.3 F (36.8 C) (Oral)  Resp 18     Physical Exam:  General: alert, cooperative and no distress CV: Regular rate and rhythm Resp: clear Abdomen: soft, nontender, normal bowel sounds Uterine Fundus: firm, below umbilicus, nontender Incision: Covered with Tegaderm and honeycomb dressing; well approximated. Lochia: minimal Ext: edema +2 to +3 pedal and pretib and Homans sign is negative, no sign of DVT    A/P: POD # 2/ G1P1001/ S/P Primary C/Section d/t failure to progress Chronic anemia, worsened by ABL anemia Urged to increase po fluids and protein Encouraged improved pain and use of vicodin to increase ambulation Doing well Continue routine post op orders Anticipate discharge home in the am   Signed: Demetrius Revel, MSN, [facility] 03/29/2013, 10:14 AM

## 2013-03-30 ENCOUNTER — Ambulatory Visit: Payer: Self-pay

## 2013-03-30 MED ORDER — IBUPROFEN 600 MG PO TABS: 600.0000 mg | ORAL_TABLET | Freq: Four times a day (QID) | ORAL | Status: AC

## 2013-03-30 MED ORDER — HYDROCODONE-ACETAMINOPHEN 5-325 MG PO TABS: 1.0000 | ORAL_TABLET | Freq: Four times a day (QID) | ORAL | Status: AC | PRN

## 2013-03-30 MED ORDER — MEASLES, MUMPS & RUBELLA VAC ~~LOC~~ INJ
0.5000 mL | INJECTION | Freq: Once | SUBCUTANEOUS | Status: AC
  Administered 2013-03-30: 0.5 mL via SUBCUTANEOUS
  Filled 2013-03-30 (×2): qty 0.5

## 2013-03-30 MED ORDER — POLYSACCHARIDE IRON COMPLEX 150 MG PO CAPS: 150.0000 mg | ORAL_CAPSULE | Freq: Two times a day (BID) | ORAL | Status: AC

## 2013-03-30 NOTE — Progress Notes (Signed)
Patient ID: Krista Alexander, female   DOB: [birth year], 32 y.o.   MRN: [id number] Subjective: POD# 3 Information for the patient's newborn:  Krista, [place] Girl Alexander [[id number]]  female  Reports feeling well Feeding: breast Patient reports tolerating PO.  Breast symptoms: difficulty latching - lactation consultant to evaluate Pain controlled with ibuprofen (OTC) and narcotic analgesics including Vicodin Denies HA/SOB/C/P/N/V/dizziness. Flatus present, no BM. She reports vaginal bleeding as normal, without clots.  She is ambulating, urinating without difficult.     Objective:   VS:  Filed Vitals:   03/28/13 1753 03/29/13 0600 03/29/13 1900 03/30/13 0644  BP: 128/78 109/69 108/72 111/71  Pulse: 76 65 82 65  Temp: 98 F (36.7 C) 98.3 F (36.8 C) 97.4 F (36.3 C) 98.5 F (36.9 C)  TempSrc: Oral Oral Oral Oral  Resp: 20 18 18 18   Height:      Weight:      SpO2:    98%    No intake or output data in the 24 hours ending 03/30/13 1057      Recent Labs  03/28/13 0620  WBC 11.1*  HGB 8.7*  HCT 25.0*  PLT 183     Blood type: --/--/A POS, A POS (11/04 0805)  Rubella: Nonimmune (03/28 0000)     Physical Exam:  General: alert, cooperative and no distress CV: Regular rate and rhythm, S1S2 present or without murmur or extra heart sounds Resp: clear Abdomen: soft, nontender, normal bowel sounds Incision: Tegaderm and Honeycomb dressing intact - skin well- approximated with sutures and dermabond Uterine Fundus: firm, 2 FB below umbilicus, nontender Lochia: minimal Ext: 1+ non-pitting edema, extremities normal, atraumatic, no cyanosis and Homans sign is negative, no sign of DVT      Assessment/Plan: 32 y.o.   POD# 3.  s/p Cesarean Delivery.  Indications: failure to progress                Principal Problem:   Postpartum care following cesarean delivery (11/5)  Doing well, stable.               Ambulate Routine post-op care Anticipate discharge today  Kenard Gower, MSN, CNM 03/30/2013, 10:57 AM

## 2013-03-30 NOTE — Lactation Note (Signed)
This note was copied from the chart of Girl Donique Hammonds. Lactation Consultation Note  Follow up visit, baby 34 hours of age.  Mom says she was just about to feed baby. Mom is holding baby standing up, when questioned what position she likes to breast feed in she reports she does a lot of walking around.  Encouraged mom to get comfortable sitting with pillow for support to learn breastfeeding. Mom initiated left breast football hold with #20 nipple shield, baby skin to skin.  Baby initially began to chew on nipple shield. With assist baby latched with wide flanged lips and rhythmic sucking, but no swallows noted.  After a few minutes encouraged mom to remove shield to see how baby would nurse.  When shield was removed not colostrum in nipple shield noted and nipple was round with no changes. Baby latched well with little assistance, but became sleeping and sluggish with sucking.  Breast massage encouraged with good swallows audible.  Mom is using DEBP and previously expressed 4 mls. Given during feeding with curved tip syringe. Baby at breast for a total of 10 minutes with about 5 minutes of good suckling. Mom denies pain with transition from nipple shield to without, but when asked about pain after feeding she reports a 5 without nipple shield and a 3 with.  Encouraged mom to only use nipple shield if needed for pain, or difficulty latching baby.  Encouraged mom to maybe start with it and transition to without if she can.  Baby has not stooled in 2 days, and swallows were better heard without nipple shield.  Mom has short and flat nipples that become more erect with reverse pressure and hand pump.  Demonstrated with mom and hand expression with good amounts of colostrum noted.   Patient Name: Girl Earnie Rockhold [id number] Date: 03/30/2013 Reason for consult: Follow-up assessment;Difficult latch   Maternal Data    Feeding Feeding Type: Breast Fed Length of feed: 5 min  LATCH  Score/Interventions Latch: Grasps breast easily, tongue down, lips flanged, rhythmical sucking. Intervention(s): Adjust position;Assist with latch;Breast massage;Breast compression  Audible Swallowing: A few with stimulation Intervention(s): Skin to skin;Hand expression Intervention(s): Alternate breast massage  Type of Nipple: Flat Intervention(s): Reverse pressure;Hand pump  Comfort (Breast/Nipple): Filling, red/small blisters or bruises, mild/mod discomfort  Problem noted: Mild/Moderate discomfort Interventions (Mild/moderate discomfort): Hand expression;Reverse pressue;Pre-pump if needed;Breast shields  Hold (Positioning): Assistance needed to correctly position infant at breast and maintain latch. Intervention(s): Breastfeeding basics reviewed;Support Pillows;Position options;Skin to skin  LATCH Score: 6  Lactation Tools Discussed/Used Tools: Nipple Shields Nipple shield size: 20 Breast pump type: Double-Electric Breast Pump   Consult Status Consult Status: Follow-up Date: 03/30/13 Follow-up type: In-patient    Jannifer Rodney 03/30/2013, 4:49 PM

## 2013-03-30 NOTE — Progress Notes (Addendum)
Patient ID: Krista Alexander, female   DOB: 1981-02-03, 32 y.o.   MRN: [id number] Patient in shower. CNM will return later for exam and discharge.  Kenard Gower., MSN, CNM 03/30/2013, 9:30am

## 2013-03-30 NOTE — Discharge Summary (Signed)
Obstetric Discharge Summary Reason for Admission: induction of labor Prenatal Procedures: NST and ultrasound Intrapartum Procedures: cesarean: low cervical, transverse Postpartum Procedures: none Complications-Operative and Postpartum: IDA with compounding acute blood loss anemia Hemoglobin  Date Value Range Status  03/28/2013 8.7* 12.0 - 15.0 g/dL Final     REPEATED TO VERIFY     DELTA CHECK NOTED     HCT  Date Value Range Status  03/28/2013 25.0* 36.0 - 46.0 % Final    Physical Exam:  General: alert, cooperative and no distress Lochia: appropriate Uterine Fundus: firm Incision: Tegaderm and Honeycomb dressing intact - skin well approximated with sutures and dermabond DVT Evaluation: No evidence of DVT seen on physical exam. Negative Homan's sign. No cords or calf tenderness. No significant calf/ankle edema.  Discharge Diagnoses: Failed induction  Discharge Information: Date: 03/30/2013 Activity: pelvic rest and no driving for 2 weeks Diet: Iron rich diet Medications: PNV, Ibuprofen, Iron and Vicodin, plan to get OTC Colace Condition: stable Instructions: refer to practice specific booklet, instructed to remove Tegaderm and Honeycomb dressings 3 days after arriving at home Discharge to: home; rooming-in with infant Follow-up Information   Follow up with Noland Fordyce A., MD. Schedule an appointment as soon as possible for a visit in 6 weeks.   Specialty:  Obstetrics and Gynecology   Contact information:   [address] [place] [address] [phone]       Newborn Data: Live born female on 03/27/2013 Birth Weight: 8 lb 0.2 oz (3634 g) APGAR: 9, 9  Infant not discharged today d/t 11% weight loss.  Kenard Gower, MSN, CNM 03/30/2013, 11:08 AM

## 2013-03-31 ENCOUNTER — Ambulatory Visit: Payer: Self-pay

## 2013-03-31 NOTE — Lactation Note (Signed)
This note was copied from the chart of Krista Diya Gervasi. Lactation Consultation Note: Mom had baby latched to breast when I went in. Reports that she has been nursing for 30 minutes. Mom requests another NS. Given but encouraged to nurse without it if possible. Now pumping whitish milk. Breasts feel firm but she reports that she has breast implants.Has own Medela Freestyle pump for home use. Reports that nipples are tender- slightly pink but intact- no breakdown noted. Has comfort gels and reports they are helping.No further questions at present. OP appointment made for Thursday 04/04/2013 at 2:30 pm. To call prn  Patient Name: Krista Alexander [id number] Date: 03/31/2013 Reason for consult: Follow-up assessment   Maternal Data    Feeding Feeding Type: Breast Fed Length of feed: 30 min  LATCH Score/Interventions Latch: Grasps breast easily, tongue down, lips flanged, rhythmical sucking. Intervention(s): Adjust position;Assist with latch  Audible Swallowing: A few with stimulation Intervention(s): Skin to skin  Type of Nipple: Everted at rest and after stimulation  Comfort (Breast/Nipple): Filling, red/small blisters or bruises, mild/mod discomfort  Problem noted: Mild/Moderate discomfort Interventions (Filling): Massage Interventions (Mild/moderate discomfort): Comfort gels  Hold (Positioning): No assistance needed to correctly position infant at breast. Intervention(s): Breastfeeding basics reviewed;Support Pillows  LATCH Score: 8  Lactation Tools Discussed/Used Tools: Nipple Shields;Comfort gels Nipple shield size: 20   Consult Status Consult Status: Follow-up Date: 04/04/13 Follow-up type: Out-patient    Pamelia Hoit 03/31/2013, 9:49 AM

## 2013-04-04 ENCOUNTER — Ambulatory Visit: Payer: Self-pay

## 2013-04-04 NOTE — Lactation Note (Signed)
This note was copied from the chart of Krista Alexander. Infant Lactation Consultation Outpatient Visit Note  Patient Name: Krista Alexander Date of Birth: 03/27/2013 Birth Weight:  8 lb 0.2 oz (3634 g) Gestational Age at Delivery: Gestational Age: [redacted]w[redacted]d Type of Delivery: C/S  Breastfeeding History Frequency of Breastfeeding: Every 2-3 hours  Length of Feeding:  20-30 minutes Voids: 4+ per day/ Stools: 3 per day/some seedy, dark green  Supplementing / Method: Pumping:  Type of Pump:  Medela FreeStyle   Frequency:  Has pumped 1 time since d/c 03/31/13  Volume:    Comments: Mom is here for feeding assessment. She has history of breast implants, was using a nipple shield off and on in the hospital but has not been using the nipple shield since home. She c/o of sore nipples, the left is more sore than the right. Mom is wearing comfort gels. She reports pain has not improved in the past few days.    Consultation Evaluation: It is noted by [facility] that Krista has a short, anterior frenulum with some restriction with tongue movement. Krista can extend her tongue to the bottom lip but not beyond the bottom lip. Mom has a short nipple shaft, both nipples exhibit abrasions, the left worse than the right. Mom has good technique with latching Krista Alexander in cross cradle at this visit. Krista has well flanged lips and demonstrates a good suckling pattern, some swallows noted. Intermittent clicking was noted by [facility] when Krista was breastfeeding and this occurred when on each breast. This seemed to occur a few minutes into the feeding then would resolve. After breast feeding on both breasts, the nipples are compressed, redness present, no bleeding and Mom reports pain with breastfeeding. The left nipple is more painful than the right. Mom reports pain with initial latch that does improve some while the Krista breastfeeds.  Krista Alexander was sleepy on the first breast (right breast) and did not transfer as  much milk as when nursing on the left. She was more vigorous at the left breast demonstrating a more effective suckling pattern and more swallows. With suck exam, Krista Alexander does develop a humping pattern that does improve with some suck training.   Initial Feeding Assessment: Pre-feed Weight:               7 lb. 2.7 oz/3250 gm Post-feed Weight:              7 lb. 3.3 oz/3268 gm Amount Transferred:    18 ml. Comments:  From right breast with nursing for 20 minutes  Additional Feeding Assessment: Pre-feed Weight:      7 lb. 3.3 oz/3268 gm Post-feed Weight:     7 lb. 4.6 oz/3306 gm. Amount Transferred:   38 ml Comments:   From left breast with nursing for 20 minutes  Additional Feeding Assessment: Pre-feed Weight: Post-feed Weight: Amount Transferred: Comments:  Total Breast milk Transferred this Visit: 56 ml Total Supplement Given: None  Additional Interventions: Discussed with Mom that her soreness may be the result of Krista Alexander having the short frenulum restricting tongue extension and Mom's short nipple shaft. Mom has discussed the option of frenotomy with Peds. Advised to have this discussion again since her breasts are still sore and this may effect Krista's Krista's milk transfer and Mom's milk supply if continues.  Encouraged Mom to pre-pump for 5 minutes or through 1 milk ejection to soften aerola and see if this helps with latch and encourage Krista to  get more hind milk as stools are reported as green. Advised to monitor voids/stools. Advised Mom Krista Oneita Jolly should be having 6 voids a day and 2-3 mustard colored stools. As of this visit, Krista Alexander has had 5 voids today and had stool at this visit, light green,yellow in color, seedy. If Krista Alexander's voids do not increase, then Mom to pump and supplement with EBM. Advised Mom to call her OB for Rx of All Purpose Nipple Cream for sore nipples. A new pair of Comfort gels given with instructions to alternate with nipple  cream.   Follow-Up Lactation OP Follow up, Tuesday, 04/09/13 at 4:00 pm     Alfred Levins 04/04/2013, 9:43 PM

## 2013-08-21 ENCOUNTER — Ambulatory Visit (HOSPITAL_COMMUNITY)
Admission: RE | Admit: 2013-08-21 | Discharge: 2013-08-21 | Disposition: A | Discharge status: Home / Self Care | Payer: BC Managed Care – PPO | Source: Ambulatory Visit | Attending: Obstetrics | Admitting: Obstetrics

## 2013-08-21 ENCOUNTER — Encounter (HOSPITAL_COMMUNITY)
Admission: RE | Admit: 2013-08-21 | Discharge: 2013-08-21 | Disposition: A | Discharge status: Home / Self Care | Payer: BC Managed Care – PPO | Source: Ambulatory Visit | Attending: Obstetrics | Admitting: Obstetrics

## 2013-08-21 DIAGNOSIS — O923 Agalactia: Secondary | ICD-10-CM | POA: Diagnosis not present

## 2013-08-21 NOTE — Lactation Note (Signed)
Adult Lactation Consultation Outpatient Visit Note  Patient Name: Krista Alexander                        YTKZ Krista Alexander, now approx 5 months old Date of Birth: 20-Jan-1981                                        DOB [birth year], Birth Weight 8 lb. 0 oz. Gestational Age at Delivery: Unknown Type of Delivery: C/S  Breastfeeding History: Frequency of Breastfeeding: Last put to breast February, Krista screams at the breast and will not sustain latch Length of Feeding:  Voids: 6+ per day Stools: 1 per day on average  Supplementing / Method: Pumping:  Type of Pump:   Medela Freestyle   Frequency:  3 times day  Volume: few drops   Comments: Mom is here interested in trying an SNS to re-introduce Krista Alexander to the breast. She stopped breastfeeding over a month ago because Krista Alexander would not sustain a latch, was screaming when Mom would try to latch her. She supplements Krista Alexander with Dory Horn formula 5 oz each feeding every 2 1/2-3 hours. Mom was trying to pump every 3 hours but had difficulty maintaining that schedule. She was receiving 1 1/2 oz of breast milk but now receives few drops regardless of how often she pumps.  She struggled with latch after delivery and has struggled with milk supply as well. Mom has history of breast augmentation. She has tried Fenugreek and More Milk Plus without any improvement in milk supply. Krista Alexander is now 62 months old. Her weight today with her clothes on was 18 lb. 6.5 oz/8350 gm.    Consultation Evaluation: LC demonstrated set up of Double SNS for Mom to use to re-introduce the breast to Krista Alexander, Krista Alexander latched well with little assist using the SNS. She breastfed on each breast approx 20 minutes and took approx 5 oz of Gerber formula. Demonstrated cleaning of SNS to Mom. Mom was pleased that she would latch using the SNS and wants to try this to re-introduce the breast. LC advised Mom that she will need to incorporate pumping every 3  hours for 15-20 minutes to maximize her chances or increasing her breast milk. Also advised Mom to call her OB for prescription for Reglan to increase milk supply since other supplements have not been working. Advised Mom to rent hospital grade DEBP to maximize pumping effectiveness. Discussed with Mom that due to her history she may not make enough milk to exclusively BF but we could try this plan for the month to see if we are getting some results. Mom seemed pleased with this plan.    Initial Feeding Assessment: Pre-feed Weight: Post-feed Weight: Amount Transferred: Comments:  Additional Feeding Assessment: Pre-feed Weight: Post-feed Weight: Amount Transferred: Comments:  Additional Feeding Assessment: Pre-feed Weight: Post-feed Weight: Amount Transferred: Comments:  Total Breast milk Transferred this Visit:  Total Supplement Given: 5 oz Gerber formula via SNS at the breast  Additional Interventions: Call OB for RX of Reglan Pump every 3 hours for 15-20 minutes using rental pump. BF using SNS to supplement at the breast as demonstrated today. If Krista Alexander will not take SNS, don't get upset, try with next feeding and try to feed her at the breast before she gets extremely hungry so she may work at the  breast better.  Encouraged to schedule another OP appt. In few weeks to see if we are making progress. Mom will call.   Follow-Up Mom to call.      Katrine Coho 08/21/2013, 6:07 PM

## 2014-01-23 ENCOUNTER — Emergency Department: Payer: Self-pay | Admitting: Emergency Medicine

## 2014-03-24 ENCOUNTER — Encounter (HOSPITAL_COMMUNITY): Payer: Self-pay | Admitting: Obstetrics
